# Patient Record
Sex: Female | Born: 1985
Health system: Southern US, Community
[De-identification: ages and names within clinical notes are randomized; demographics above are authoritative.]

---

## 2011-09-27 ENCOUNTER — Ambulatory Visit
Admission: RE | Admit: 2011-09-27 | Discharge: 2011-09-27 | Disposition: A | Discharge status: Home / Self Care | Payer: PRIVATE HEALTH INSURANCE | Source: Ambulatory Visit | Attending: Occupational Medicine | Admitting: Occupational Medicine

## 2011-09-27 ENCOUNTER — Other Ambulatory Visit: Payer: Self-pay | Admitting: Occupational Medicine

## 2011-09-27 DIAGNOSIS — Z021 Encounter for pre-employment examination: Secondary | ICD-10-CM

## 2012-08-13 ENCOUNTER — Encounter: Payer: Self-pay | Admitting: Obstetrics and Gynecology

## 2012-09-01 ENCOUNTER — Encounter: Payer: 59 | Admitting: Obstetrics and Gynecology

## 2012-09-08 ENCOUNTER — Encounter: Payer: Self-pay | Admitting: Obstetrics and Gynecology

## 2012-09-08 ENCOUNTER — Ambulatory Visit (INDEPENDENT_AMBULATORY_CARE_PROVIDER_SITE_OTHER): Payer: 59 | Admitting: Obstetrics and Gynecology

## 2012-09-08 VITALS — BP 118/76 | HR 78 | Resp 16 | Ht 60.0 in | Wt 133.0 lb

## 2012-09-08 DIAGNOSIS — Z124 Encounter for screening for malignant neoplasm of cervix: Secondary | ICD-10-CM

## 2012-09-08 DIAGNOSIS — N898 Other specified noninflammatory disorders of vagina: Secondary | ICD-10-CM | POA: Insufficient documentation

## 2012-09-08 LAB — POCT WET PREP (WET MOUNT)
Clue Cells Wet Prep Whiff POC: NEGATIVE
Whiff Test: NEGATIVE

## 2012-09-08 LAB — POCT OSOM BVBLUE TEST: Bacterial Vaginosis: NEGATIVE

## 2012-09-08 MED ORDER — LUVENA VAGINAL MOISTURIZER VA GEL: 1.0000 | VAGINAL | Status: DC

## 2012-09-08 NOTE — Progress Notes (Signed)
ANNUAL:  Last Pap: pt cannot recall WNL: Yes Regular Periods:yes Contraception: None.  Has never been sexually active  Monthly Breast exam:no Tetanus<9yrs:no pt cannot recall Nl.Bladder Function:yes Daily BMs:yes Healthy Diet:yes Calcium:no Mammogram:no Date of Mammogram: N/A Exercise:yes Have often Exercise: once a week Seatbelt: yes Abuse at home: no Stressful work:yes "Sometimes" Sigmoid-colonoscopy: Never Bone Density: No PCP: No PCP Change in PMH: No Changes Change in FMH:No Changes  Vaginal Discharge: Color: White/yellow Odor: yes Itching:no Thin:no Thick:yes Fever:no Dyspareunia:no Hx PID:no HX STD:no Pelvic Pain:no Desires Gc/CT:no Desires HIV,RPR,HbsAG:no  Subjective:    Selena Morse is a 26 y.o. female, G0P0, who presents for an annual exam, and to investigate vaginal discharge which occurs randomly and intermittently without other sx.  She denies pain other than mild menstrual cramps.  She is not sexually active.     History   Social History  . Marital Status: Single    Spouse Name: N/A    Number of Children: N/A  . Years of Education: N/A   Social History Main Topics  . Smoking status: Never Smoker   . Smokeless tobacco: Never Used  . Alcohol Use: No  . Drug Use: No  . Sexually Active: No   Other Topics Concern  . None   Social History Narrative  . None    Menstrual cycle:   LMP: Patient's last menstrual period was 09/02/2012.           Cycle: regular every 28-30 days  The following portions of the patient's history were reviewed and updated as appropriate: allergies, current medications, past family history, past medical history, past social history, past surgical history and problem list.  Review of Systems Pertinent items are noted in HPI. Breast:Negative for breast lump,nipple discharge or nipple retraction Gastrointestinal: Negative for abdominal pain, change in bowel habits or rectal bleeding Urinary:negative     Objective:    Ht 5' (1.524 m)  Wt 133 lb (60.328 kg)  BMI 25.97 kg/m2  LMP 09/02/2012    Weight:  Wt Readings from Last 1 Encounters:  09/08/12 133 lb (60.328 kg)          BMI: Body mass index is 25.97 kg/(m^2).  General Appearance: Alert, appropriate appearance for age. No acute distress HEENT: Grossly normal Neck / Thyroid: Supple, no masses, nodes or enlargement Lungs: clear to auscultation bilaterally Back: No CVA tenderness Breast Exam: Normal breast tissue bilaterally and No masses or nodes.No dimpling, nipple retraction or discharge. Cardiovascular: Regular rate and rhythm. S1, S2, no murmur Gastrointestinal: Soft, non-tender, no masses or organomegaly Pelvic Exam: External genitalia: Right buttock skin tag 5mm in diameter and fleshy.  No tenderness of induration Vaginal: normal rugae and discharge, yellow and mucoid Cervix: ectropian, with mucoid discharge.  Bluish lesions at 6 o'clock Adnexa: no masses Uterus: normal single, nontender Rectovaginal: normal rectal, no masses Lymphatic Exam: Non-palpable nodes in neck, clavicular, axillary, or inguinal regions Skin: no rash or abnormalities Neurologic: Normal gait and speech, no tremor  Psychiatric: Alert and oriented, appropriate affect.   Wet Prep:no pathogens and pH 4.5 OSOM TRICH: NEG OSOM BV: NEG Urinalysis:not applicable UPT: Not done   Assessment:    Asymptomatic, perhaps physiologic discharge.  Blue cervical lesions   Plan:    pap smear.  If normal and  Discharge responds to acidification of the vagina with Luvena, will f/u in 1 yr.  If not will do cx bx return annually or prn STD screening: discussed, declined Contraception:abstinence   Dierdre Forth MD

## 2012-09-08 NOTE — Patient Instructions (Signed)
UP TO DATE info on vaginal discharge

## 2012-09-09 ENCOUNTER — Other Ambulatory Visit: Payer: Self-pay

## 2012-09-09 LAB — PAP IG W/ RFLX HPV ASCU

## 2012-09-09 MED ORDER — LUVENA VAGINAL MOISTURIZER VA GEL: 1.0000 | VAGINAL | Status: DC

## 2013-01-02 IMAGING — CR DG CHEST 1V
1 series · 1 of 1 positions shown · non-contrast
Comparison: None.

CLINICAL DATA: Pre employment chest x-ray

CHEST - 1 VIEW

[view not recorded]
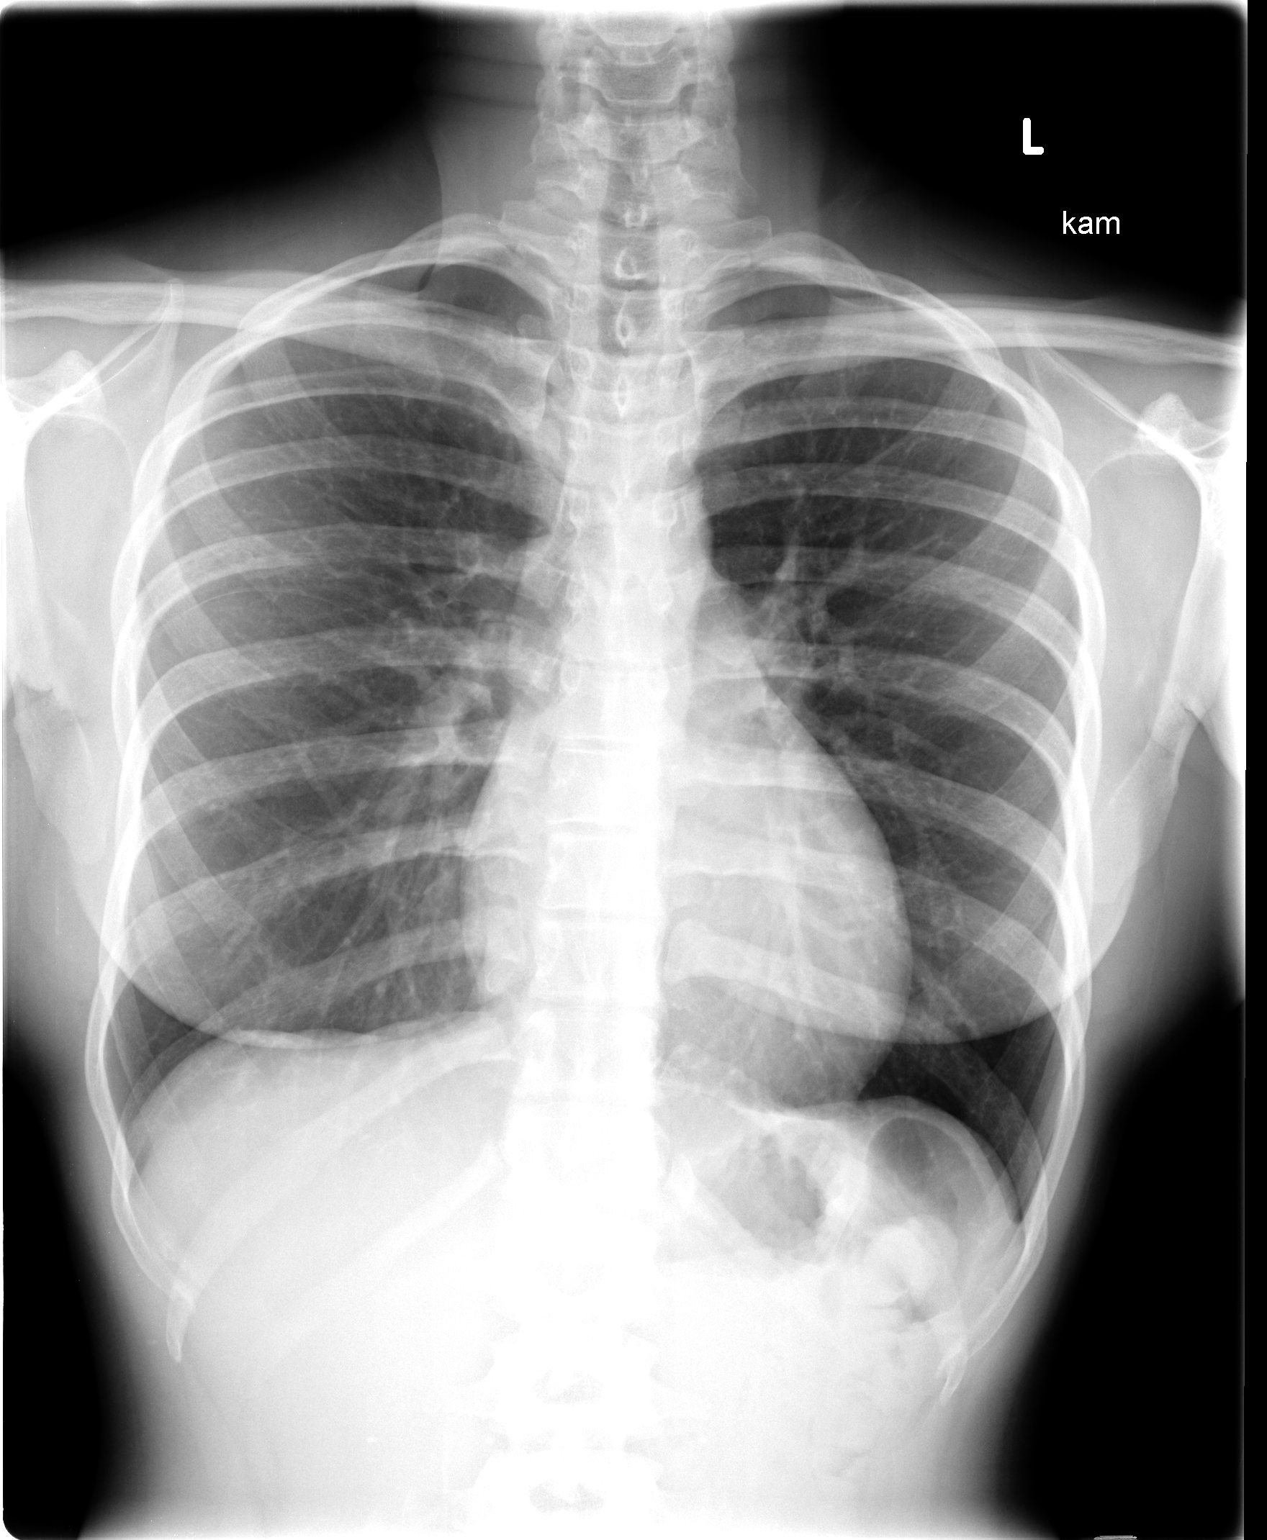

[1 of 1 positions shown; findings below may reference images not displayed]

FINDINGS: The lungs are clear.  Mediastinal contours appear normal.
The heart is within normal limits in size.  No bony abnormality is
seen.
IMPRESSION: No active lung disease.

## 2015-02-08 ENCOUNTER — Ambulatory Visit (INDEPENDENT_AMBULATORY_CARE_PROVIDER_SITE_OTHER): Payer: 59 | Admitting: Family Medicine

## 2015-02-08 VITALS — BP 118/68 | HR 80 | Temp 98.0°F | Resp 16 | Ht 62.5 in | Wt 131.6 lb

## 2015-02-08 DIAGNOSIS — R05 Cough: Secondary | ICD-10-CM | POA: Diagnosis not present

## 2015-02-08 DIAGNOSIS — J40 Bronchitis, not specified as acute or chronic: Secondary | ICD-10-CM

## 2015-02-08 DIAGNOSIS — R059 Cough, unspecified: Secondary | ICD-10-CM

## 2015-02-08 MED ORDER — AMOXICILLIN 875 MG PO TABS: 875.0000 mg | ORAL_TABLET | Freq: Two times a day (BID) | ORAL | Status: DC

## 2015-02-08 MED ORDER — BENZONATATE 100 MG PO CAPS: 100.0000 mg | ORAL_CAPSULE | Freq: Three times a day (TID) | ORAL | Status: DC | PRN

## 2015-02-08 MED ORDER — HYDROCODONE-HOMATROPINE 5-1.5 MG/5ML PO SYRP: 5.0000 mL | ORAL_SOLUTION | ORAL | Status: DC | PRN

## 2015-02-08 NOTE — Patient Instructions (Signed)
Drink plenty of fluids and get enough rest  Take the benzonatate cough pills one or 2 pills 3 times daily as needed for daytime cough  Take the cough syrup 1 teaspoon every 4-6 hours as needed. 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

## 2015-02-08 NOTE — Progress Notes (Signed)
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 She's been feeling ill for the last couple of weeks. She's had a respiratory tract infection and settled in her chest with a cough. The cough has been worse at night of late. She awakens with paroxysms of coughing. She has tried over-the-counter medication with some relief but not good relief. She does not smoke. She is generally healthy and not on any regular medications. She is currently on her menstrual cycle.  Objective: Pleasant, healthy-appearing young lady. Her TMs are normal. Throat without erythema. Neck supple without significant nodes. No thyromegaly. Chest clear to auscultation. Heart regular without murmurs.  Assessment: Cough Post viral bronchitis  Plan: Amoxicillin 875 twice daily Benzonatate for daytime cough Hycodan cough syrup at night  Return if worse

## 2015-02-20 ENCOUNTER — Ambulatory Visit (INDEPENDENT_AMBULATORY_CARE_PROVIDER_SITE_OTHER): Payer: 59 | Admitting: Internal Medicine

## 2015-02-20 ENCOUNTER — Ambulatory Visit (INDEPENDENT_AMBULATORY_CARE_PROVIDER_SITE_OTHER): Payer: 59

## 2015-02-20 VITALS — BP 112/74 | HR 98 | Temp 97.9°F | Resp 16 | Ht 61.0 in | Wt 128.6 lb

## 2015-02-20 DIAGNOSIS — R05 Cough: Secondary | ICD-10-CM

## 2015-02-20 DIAGNOSIS — R058 Other specified cough: Secondary | ICD-10-CM

## 2015-02-20 DIAGNOSIS — R059 Cough, unspecified: Secondary | ICD-10-CM

## 2015-02-20 DIAGNOSIS — R5383 Other fatigue: Secondary | ICD-10-CM

## 2015-02-20 LAB — POCT CBC
GRANULOCYTE PERCENT: 72 % (ref 37–80)
HCT, POC: 38.8 % (ref 37.7–47.9)
HEMOGLOBIN: 12.4 g/dL (ref 12.2–16.2)
Lymph, poc: 1.3 (ref 0.6–3.4)
MCH, POC: 24.6 pg — AB (ref 27–31.2)
MCHC: 32 g/dL (ref 31.8–35.4)
MCV: 77 fL — AB (ref 80–97)
MID (cbc): 0.3 (ref 0–0.9)
MPV: 7.5 fL (ref 0–99.8)
POC GRANULOCYTE: 4 (ref 2–6.9)
POC LYMPH PERCENT: 23.3 %L (ref 10–50)
POC MID %: 4.7 % (ref 0–12)
Platelet Count, POC: 237 10*3/uL (ref 142–424)
RBC: 5.04 M/uL (ref 4.04–5.48)
RDW, POC: 15.1 %
WBC: 5.6 10*3/uL (ref 4.6–10.2)

## 2015-02-20 MED ORDER — HYDROCODONE-HOMATROPINE 5-1.5 MG/5ML PO SYRP: 5.0000 mL | ORAL_SOLUTION | ORAL | Status: DC | PRN

## 2015-02-20 MED ORDER — BENZONATATE 100 MG PO CAPS: 100.0000 mg | ORAL_CAPSULE | Freq: Three times a day (TID) | ORAL | Status: DC | PRN

## 2015-02-20 NOTE — Progress Notes (Addendum)
Subjective:    Patient ID: Selena Morse, female    DOB: 1986-07-31, 29 y.o.   MRN: [id number]  HPI This chart was scribed for Ellamae Sia, MD by Andrew Au, ED Scribe. This patient was seen in room 5 and the patient's care was started at 5:29 PM.  HPI Comments: Selena Morse is a 29 y.o. female who presents to the [facility] for follow up regarding cough. Pt states she was seen here 2 weeks ago for a lingering cough that had progressively worsened to a harsh cough. Pt states cough has returned  and states she currently does not feel well with fatigue and HA. She reports cough is not as bad as it was 2 weeks ago but states it still worsens during the night. Pt denies pain with cough. Pt denies a decrease in appetite, fever, weight loss, allergies, nausea, emesis, diarrhea, constipation, sore throat, rhinorrhea, trouble breathing, nocturia, urinary frequency, dysuria. Pt denies GERD and indigestion. LMP was 2 weeks. 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   History reviewed. No pertinent past medical history. History reviewed. No pertinent past surgical history. Prior to Admission medications   Medication Sig Start Date End Date Taking? Authorizing Provider  benzonatate (TESSALON) 100 MG capsule Take 1-2 capsules (100-200 mg total) by mouth 3 (three) times daily as needed. Patient not taking: Reported on 02/20/2015 02/08/15   Peyton Najjar, MD  HYDROcodone-homatropine [facility]) 5-1.5 MG/5ML syrup Take 5 mLs by mouth every 4 (four) hours as needed. Patient not taking: Reported on 02/20/2015 02/08/15   Peyton Najjar, MD     Review of Systems  Constitutional: Positive for fatigue. Negative for fever, chills, activity change, appetite change and unexpected weight change.  HENT: Negative for congestion, postnasal drip, rhinorrhea, sneezing, sore throat and trouble swallowing.   Respiratory: Negative for apnea, chest tightness and shortness of breath.     Cardiovascular: Negative for leg swelling.  Gastrointestinal: Negative for nausea, vomiting, abdominal pain and diarrhea.       No acid reflux or indigestion  Genitourinary: Negative for dysuria, frequency, difficulty urinating, menstrual problem and pelvic pain.  Musculoskeletal: Negative for back pain and joint swelling.  Skin: Negative for rash.  Neurological: Positive for headaches. Negative for dizziness and light-headedness.       Occasional not requiring medication or lasting very long or interfering with exercise///no associated vision changes or nausea  Hematological: Does not bruise/bleed easily.  Psychiatric/Behavioral: Negative for behavioral problems and sleep disturbance.       Objective:   Physical Exam  Constitutional: She is oriented to person, place, and time. She appears well-developed and well-nourished. No distress.  HENT:  Head: Normocephalic and atraumatic.  Right Ear: External ear normal.  Left Ear: External ear normal.  Nose: Nose normal.  Mouth/Throat: Oropharynx is clear and moist.  Eyes: Conjunctivae and EOM are normal. Pupils are equal, round, and reactive to light.  Neck: Neck supple. No thyromegaly present.  Cardiovascular: Normal rate, regular rhythm, normal heart sounds and intact distal pulses.   No murmur heard. Pulmonary/Chest: Effort normal and breath sounds normal. No respiratory distress. She has no wheezes.  Abdominal: Soft. Bowel sounds are normal. She exhibits no distension and no mass. There is no tenderness. There is no rebound and no guarding.  Musculoskeletal: Normal range of motion. She exhibits no edema.  Lymphadenopathy:    She has no cervical adenopathy.  Neurological: She is alert and oriented to person, place,  and time. No cranial nerve deficit.  Skin: Skin is warm and dry.  Psychiatric: She has a normal mood and affect. Her behavior is normal.  Nursing note and vitals reviewed. [facility] reading (PRIMARY) by  Dr. Merla Richesoolittle= chest  x-ray normal  BP 112/74 mmHg  Pulse 98  Temp(Src) 97.9 F (36.6 C) (Oral)  Resp 16  Ht 5\' 1"  (1.549 m)  Wt 128 lb 9.6 oz (58.333 kg)  BMI 24.31 kg/m2  SpO2 98%  LMP 02/08/2015   Assessment & Plan:  Cough present for greater than 3 weeks - Plan: With everything appearing normal we will go to hydrocodone for nighttime and Tessalon Perles for daytime as treatment. If this does not resolve her problems will consider steroids and inhalers next even tho she does not have wheezing  Other fatigue - Plan: TSH, Comprehensive metabolic panel Call with results This may be secondary to sleep deprivation from nocturnal coughing  Reassured regarding negative findings///return in 3-4 weeks if not well  I have completed the patient encounter in its entirety as documented by the scribe, with editing by me where necessary. Lavell Supple P. Merla Richesoolittle, M.D.

## 2015-02-21 LAB — COMPREHENSIVE METABOLIC PANEL
ALBUMIN: 4.7 g/dL (ref 3.5–5.2)
ALK PHOS: 47 U/L (ref 39–117)
ALT: 17 U/L (ref 0–35)
AST: 22 U/L (ref 0–37)
BUN: 13 mg/dL (ref 6–23)
CO2: 27 meq/L (ref 19–32)
Calcium: 10.1 mg/dL (ref 8.4–10.5)
Chloride: 101 mEq/L (ref 96–112)
Creat: 0.7 mg/dL (ref 0.50–1.10)
Glucose, Bld: 84 mg/dL (ref 70–99)
POTASSIUM: 4.4 meq/L (ref 3.5–5.3)
SODIUM: 136 meq/L (ref 135–145)
TOTAL PROTEIN: 7.7 g/dL (ref 6.0–8.3)
Total Bilirubin: 0.4 mg/dL (ref 0.2–1.2)

## 2015-02-21 LAB — TSH: TSH: 2.459 u[IU]/mL (ref 0.350–4.500)

## 2016-05-28 IMAGING — CR DG CHEST 2V
2 series · 2 of 2 positions shown · non-contrast
Comparison: None.

CLINICAL DATA: Cough for 2 weeks

EXAM:
CHEST  2 VIEW

[PA]
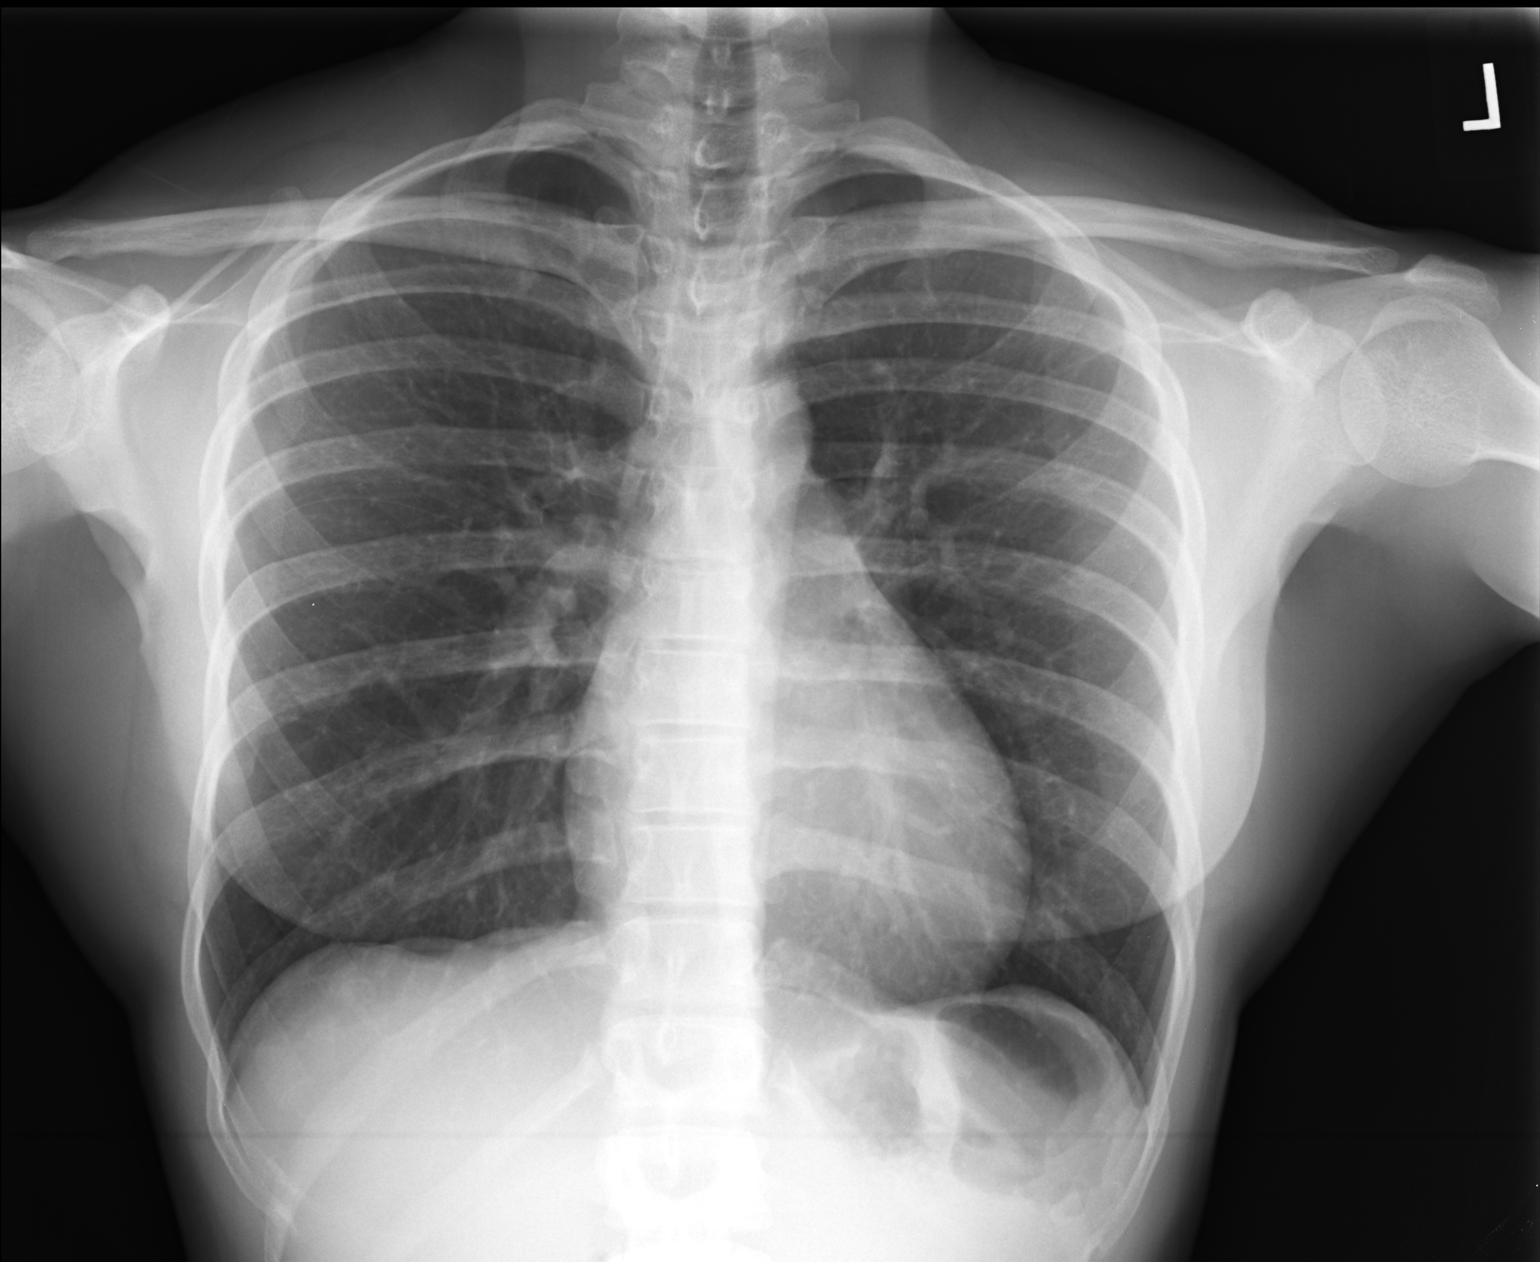

[lateral]
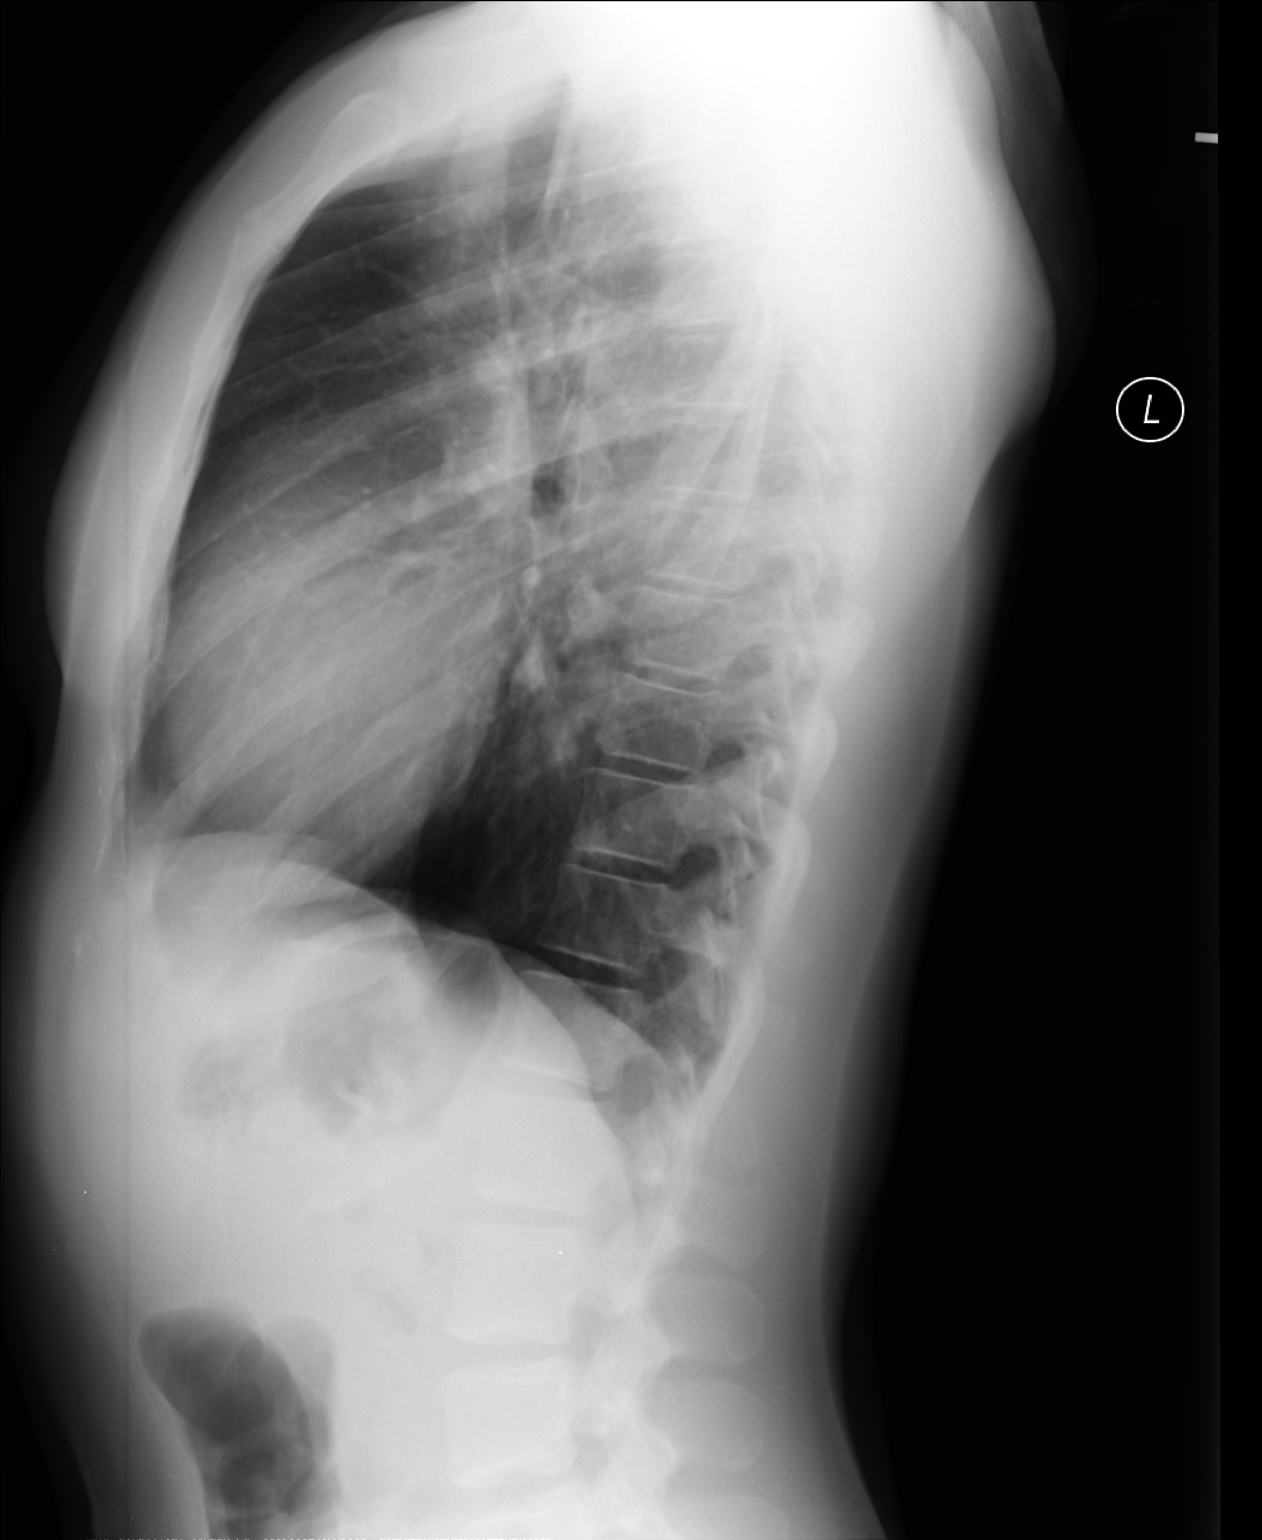

[2 of 2 positions shown; findings below may reference images not displayed]

FINDINGS: Normal mediastinum and cardiac silhouette. Normal pulmonary
vasculature. No evidence of effusion, infiltrate, or pneumothorax.
No acute bony abnormality.
IMPRESSION: Normal chest radiograph.

## 2017-03-25 ENCOUNTER — Encounter: Payer: Self-pay | Admitting: Physician Assistant

## 2017-03-25 ENCOUNTER — Ambulatory Visit (INDEPENDENT_AMBULATORY_CARE_PROVIDER_SITE_OTHER): Payer: 59 | Admitting: Physician Assistant

## 2017-03-25 VITALS — BP 121/76 | HR 76 | Temp 97.8°F | Resp 18 | Ht 61.0 in | Wt 134.0 lb

## 2017-03-25 DIAGNOSIS — L03116 Cellulitis of left lower limb: Secondary | ICD-10-CM

## 2017-03-25 MED ORDER — CEPHALEXIN 500 MG PO CAPS: 500.0000 mg | ORAL_CAPSULE | Freq: Two times a day (BID) | ORAL | 0 refills | Status: AC

## 2017-03-25 MED ORDER — MUPIROCIN 2 % EX OINT: 1.0000 "application " | TOPICAL_OINTMENT | Freq: Two times a day (BID) | CUTANEOUS | 0 refills | Status: AC

## 2017-03-25 NOTE — Patient Instructions (Addendum)
Cool compresses  Keep covered and use the cream over the blister  Finish all the antibiotic    IF you received an x-ray today, you will receive an invoice from [facility]. Please contact [facility] at [phone] with questions or concerns regarding your invoice.   IF you received labwork today, you will receive an invoice from [facility]. Please contact [facility] at [phone] with questions or concerns regarding your invoice.   Our billing staff will not be able to assist you with questions regarding bills from these companies.  You will be contacted with the lab results as soon as they are available. The fastest way to get your results is to activate your My Chart account. Instructions are located on the last page of this paperwork. If you have not heard from us regarding the results in 2 weeks, please contact this office.

## 2017-03-25 NOTE — Progress Notes (Signed)
   Selena LukeKendra Morse  MRN: [id number] DOB: 1986/03/07  PCP: Morrell RiddleWeber, Adaly Puder L, PA-C  Chief Complaint  Patient presents with  . Insect Bite    left ankle x 3days     Subjective:  Pt presents to clinic for insect bite that she got about 3 days ago - it was really itchy but then this am the itchy continues but now it is swollen and sore.  She has used ETOH to clean the wound.  She wants to make sure it is ok before she works out and maybe causes problems.  Review of Systems  Constitutional: Negative for chills and fever.  Skin: Positive for wound.    There are no active problems to display for this patient.   No current outpatient prescriptions on file prior to visit.   No current facility-administered medications on file prior to visit.     No Known Allergies  Pt patients past, family and social history were reviewed and updated.   Objective:  BP 121/76   Pulse 76   Temp 97.8 F (36.6 C) (Oral)   Resp 18   Ht 5\' 1"  (1.549 m)   Wt 134 lb (60.8 kg)   LMP 02/24/2017 (Approximate)   SpO2 98%   BMI 25.32 kg/m   Physical Exam  Constitutional: She is oriented to person, place, and time and well-developed, well-nourished, and in no distress.  HENT:  Head: Normocephalic and atraumatic.  Right Ear: Hearing and external ear normal.  Left Ear: Hearing and external ear normal.  Eyes: Conjunctivae are normal.  Neck: Normal range of motion.  Pulmonary/Chest: Effort normal.  Neurological: She is alert and oriented to person, place, and time. Gait normal.  Skin: Skin is warm and dry.  Left medial ankle - central vesicle with surrounding erythema and induration that is more anterior to the vesicle - the induration is about 2x3cm total and TTP, no warmth  Psychiatric: Mood, memory, affect and judgment normal.  Vitals reviewed.   Assessment and Plan :  Cellulitis of left ankle - Plan: cephALEXin (KEFLEX) 500 MG capsule, mupirocin ointment (BACTROBAN) 2 % - due to extensive induration  and pain we will cover with oral abx -- keep covered to prevent open wound - bactroban to place on wound is vesicle should open.  Benny LennertSarah Arrian Manson PA-C  [facility] 03/25/2017 5:41 PM

## 2017-09-25 ENCOUNTER — Ambulatory Visit
Admission: RE | Admit: 2017-09-25 | Discharge: 2017-09-25 | Disposition: A | Discharge status: Home / Self Care | Payer: No Typology Code available for payment source | Source: Ambulatory Visit | Attending: Occupational Medicine | Admitting: Occupational Medicine

## 2017-09-25 ENCOUNTER — Other Ambulatory Visit: Payer: Self-pay | Admitting: Occupational Medicine

## 2017-09-25 DIAGNOSIS — Z021 Encounter for pre-employment examination: Secondary | ICD-10-CM

## 2018-05-23 ENCOUNTER — Ambulatory Visit (HOSPITAL_COMMUNITY)
Admission: EM | Admit: 2018-05-23 | Discharge: 2018-05-23 | Disposition: A | Discharge status: Home / Self Care | Payer: 59 | Attending: Family Medicine | Admitting: Family Medicine

## 2018-05-23 ENCOUNTER — Encounter (HOSPITAL_COMMUNITY): Payer: Self-pay

## 2018-05-23 ENCOUNTER — Other Ambulatory Visit: Payer: Self-pay

## 2018-05-23 DIAGNOSIS — L259 Unspecified contact dermatitis, unspecified cause: Secondary | ICD-10-CM

## 2018-05-23 MED ORDER — TRIAMCINOLONE ACETONIDE 0.1 % EX CREA: 1.0000 "application " | TOPICAL_CREAM | Freq: Two times a day (BID) | CUTANEOUS | 0 refills | Status: AC

## 2018-05-23 NOTE — ED Notes (Signed)
Pt discharged by provider.

## 2018-05-23 NOTE — ED Triage Notes (Signed)
[facility] bite on chest and back since last night, pt complains of pain and itching, pt has applied alcohol to infected areas.

## 2018-05-25 NOTE — ED Provider Notes (Signed)
[facility]   [id number] 05/23/18 Arrival Time: 1106  ASSESSMENT & PLAN:  1. Contact dermatitis, unspecified contact dermatitis type, unspecified trigger    Meds ordered this encounter  Medications  . triamcinolone cream (KENALOG) 0.1 %    Sig: Apply 1 application topically 2 (two) times daily.    Dispense:  30 g    Refill:  0   Will follow up with PCP or here if worsening or failing to improve as anticipated. Reviewed expectations re: course of current medical issues. Questions answered. Outlined signs and symptoms indicating need for more acute intervention. Patient verbalized understanding. After Visit Summary given.   SUBJECTIVE:  Selena Morse is a 32 y.o. female who presents with a skin complaint. She questions if insect bite related.    Location: anterior upper R chest and posterior upper R back Onset: abrupt Duration: 1 day Pruritic? Yes Painful? No Progression: stable  Drainage? No  Known trigger? No  New soaps/lotions/topicals/detergents? No Environmental exposures or allergies? none Contacts with similar? No Recent travel? No  Other associated symptoms: none Therapies tried thus far: none Denies fever. No specific aggravating or alleviating factors reported.   ROS: As per HPI.  OBJECTIVE: Vitals:   05/23/18 1201 05/23/18 1202  BP: 119/71   Pulse: 85   Resp: 17   Temp: 99 F (37.2 C)   TempSrc: Oral   SpO2: 99% 99%    General appearance: alert; no distress Lungs: clear to auscultation bilaterally Heart: regular rate and rhythm Extremities: no edema Skin: warm and dry; small patch of irritated skin on anterior R upper chest and R upper back; both areas 1-1.5cm in diameter; no induration or fluctuance Psychological: alert and cooperative; normal mood and affect  No Known Allergies  History reviewed. No pertinent past medical history. Social History   Socioeconomic History  . Marital status: Single    Spouse name: Not on file    . Number of children: Not on file  . Years of education: Not on file  . Highest education level: Not on file  Occupational History  . Not on file  Social Needs  . Financial resource strain: Not on file  . Food insecurity:    Worry: Not on file    Inability: Not on file  . Transportation needs:    Medical: Not on file    Non-medical: Not on file  Tobacco Use  . Smoking status: Never Smoker  . Smokeless tobacco: Never Used  Substance and Sexual Activity  . Alcohol use: No  . Drug use: No  . Sexual activity: Never    Birth control/protection: None  Lifestyle  . Physical activity:    Days per week: Not on file    Minutes per session: Not on file  . Stress: Not on file  Relationships  . Social connections:    Talks on phone: Not on file    Gets together: Not on file    Attends religious service: Not on file    Active member of club or organization: Not on file    Attends meetings of clubs or organizations: Not on file    Relationship status: Not on file  . Intimate partner violence:    Fear of current or ex partner: Not on file    Emotionally abused: Not on file    Physically abused: Not on file    Forced sexual activity: Not on file  Other Topics Concern  . Not on file  Social History Narrative  .  Not on file   Family History  Problem Relation Age of Onset  . Cancer Maternal Grandmother        Breast   . Hypertension Father   . Hypertension Mother    History reviewed. No pertinent surgical history.   Mardella LaymanHagler, Khalib Fendley, MD 06/02/18 1319

## 2019-01-01 IMAGING — CR DG CHEST 1V
1 series · 1 of 1 positions shown · non-contrast
Comparison: Radiograph September 27, 2011.

CLINICAL DATA: Pre-employment examination.

EXAM:
CHEST 1 VIEW

[w chest pa]
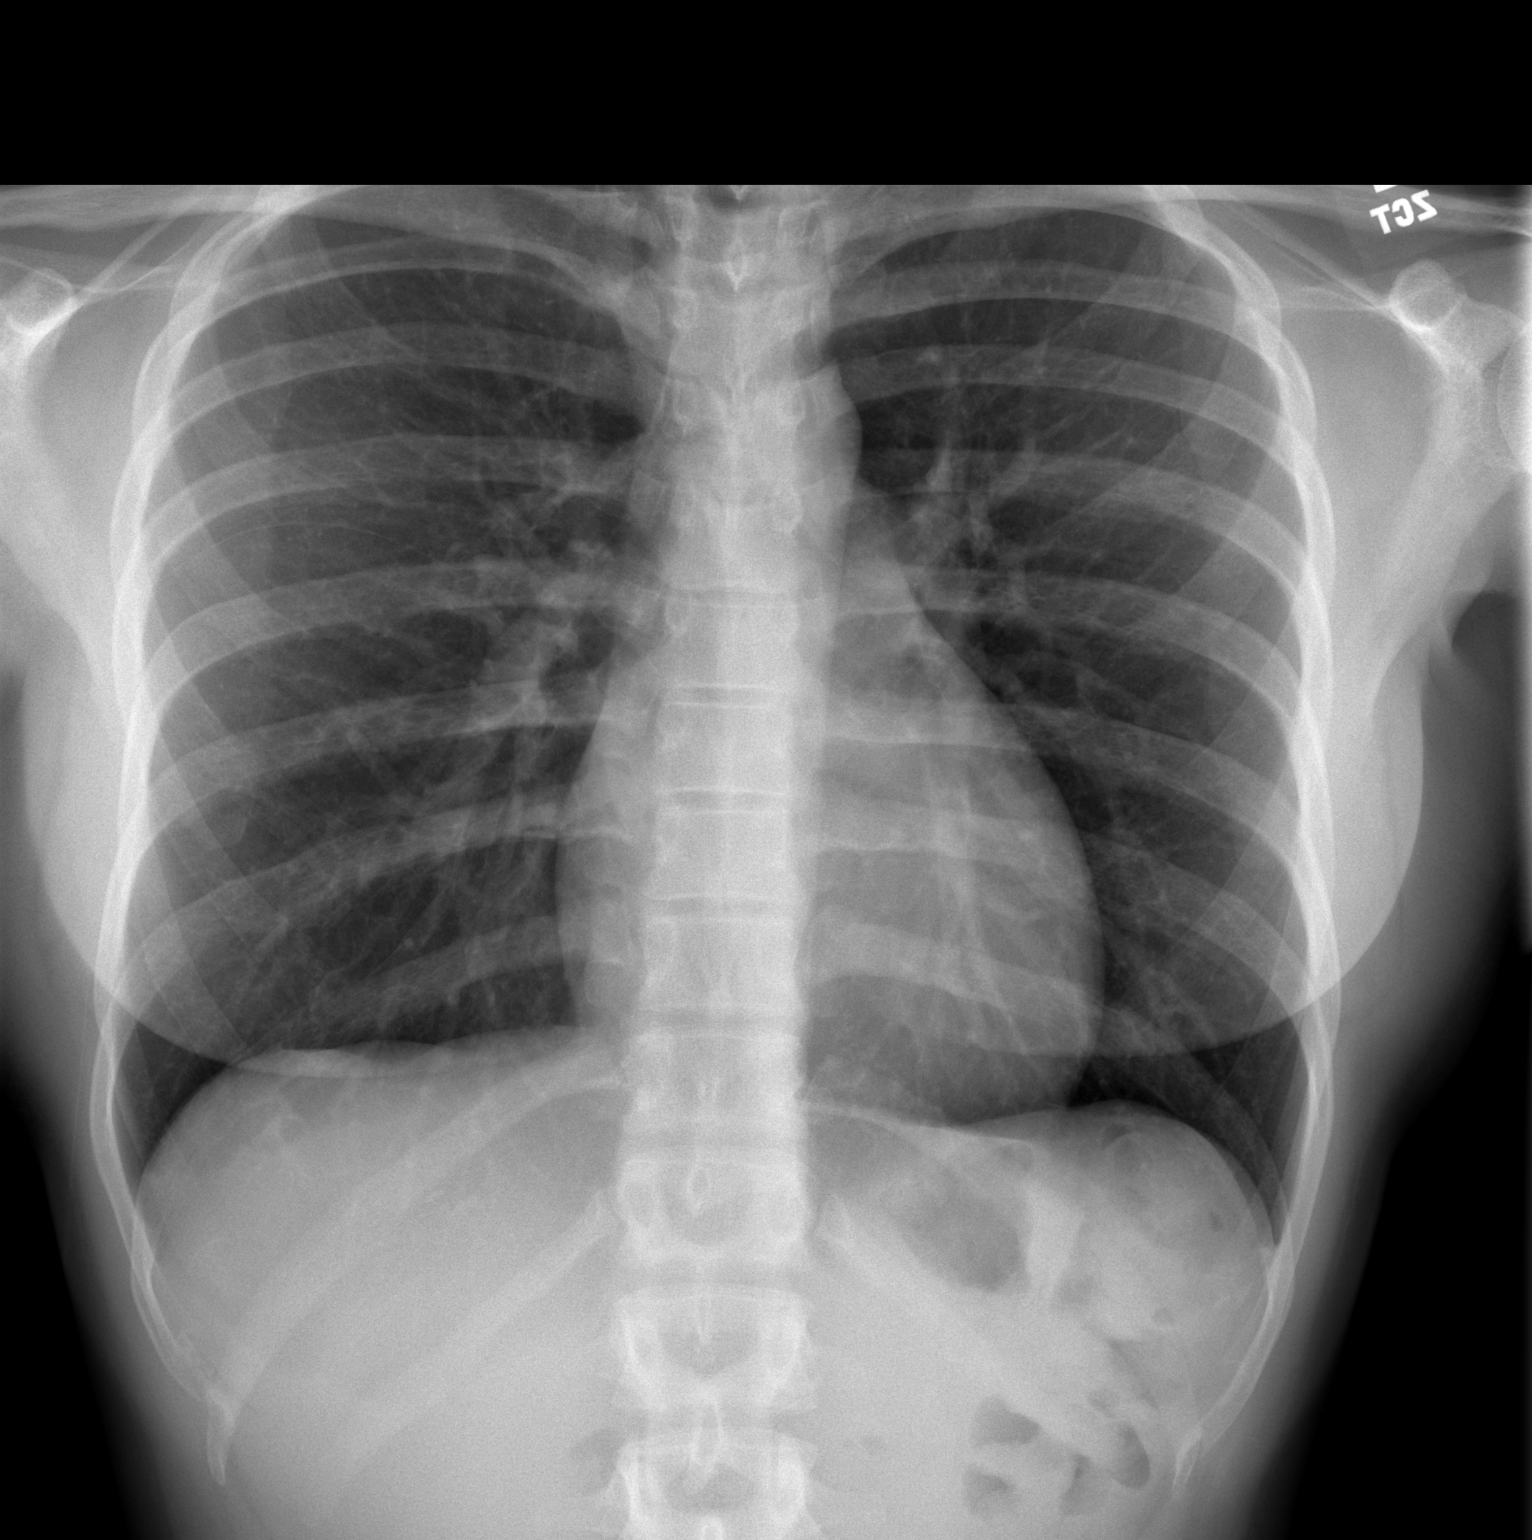

[1 of 1 positions shown; findings below may reference images not displayed]

FINDINGS: The heart size and mediastinal contours are within normal limits.
Both lungs are clear. The visualized skeletal structures are
unremarkable.
IMPRESSION: No active disease.

## 2021-04-17 ENCOUNTER — Ambulatory Visit: Payer: 59 | Admitting: Neurology
